# Patient Record
Sex: Male | Born: 1975 | Race: Black or African American | Hispanic: No | Marital: Single | Smoking: Never smoker
Health system: Southern US, Community
[De-identification: ages and names within clinical notes are randomized; demographics above are authoritative.]

## PROBLEM LIST (undated history)

## (undated) DIAGNOSIS — E7800 Pure hypercholesterolemia, unspecified: Secondary | ICD-10-CM

## (undated) DIAGNOSIS — R569 Unspecified convulsions: Secondary | ICD-10-CM

## (undated) HISTORY — PX: OTHER SURGICAL HISTORY: SHX169

---

## 1999-11-06 ENCOUNTER — Emergency Department (HOSPITAL_COMMUNITY): Admission: EM | Admit: 1999-11-06 | Discharge: 1999-11-06 | Payer: Self-pay | Admitting: Emergency Medicine

## 1999-11-12 ENCOUNTER — Emergency Department (HOSPITAL_COMMUNITY): Admission: EM | Admit: 1999-11-12 | Discharge: 1999-11-12 | Payer: Self-pay | Admitting: *Deleted

## 2009-06-17 ENCOUNTER — Emergency Department (HOSPITAL_BASED_OUTPATIENT_CLINIC_OR_DEPARTMENT_OTHER): Admission: EM | Admit: 2009-06-17 | Discharge: 2009-06-17 | Payer: Self-pay | Admitting: Emergency Medicine

## 2009-06-17 ENCOUNTER — Ambulatory Visit: Payer: Self-pay | Admitting: Diagnostic Radiology

## 2015-09-19 ENCOUNTER — Emergency Department (HOSPITAL_BASED_OUTPATIENT_CLINIC_OR_DEPARTMENT_OTHER)
Admission: EM | Admit: 2015-09-19 | Discharge: 2015-09-19 | Disposition: A | Discharge status: Home / Self Care | Payer: Medicare Other | Attending: Physician Assistant | Admitting: Physician Assistant

## 2015-09-19 ENCOUNTER — Emergency Department (HOSPITAL_BASED_OUTPATIENT_CLINIC_OR_DEPARTMENT_OTHER): Payer: Medicare Other

## 2015-09-19 ENCOUNTER — Encounter (HOSPITAL_BASED_OUTPATIENT_CLINIC_OR_DEPARTMENT_OTHER): Payer: Self-pay

## 2015-09-19 DIAGNOSIS — G40909 Epilepsy, unspecified, not intractable, without status epilepticus: Secondary | ICD-10-CM | POA: Diagnosis not present

## 2015-09-19 DIAGNOSIS — Y998 Other external cause status: Secondary | ICD-10-CM | POA: Diagnosis not present

## 2015-09-19 DIAGNOSIS — Y9389 Activity, other specified: Secondary | ICD-10-CM | POA: Insufficient documentation

## 2015-09-19 DIAGNOSIS — S299XXA Unspecified injury of thorax, initial encounter: Secondary | ICD-10-CM | POA: Diagnosis present

## 2015-09-19 DIAGNOSIS — W1839XA Other fall on same level, initial encounter: Secondary | ICD-10-CM | POA: Insufficient documentation

## 2015-09-19 DIAGNOSIS — E7800 Pure hypercholesterolemia, unspecified: Secondary | ICD-10-CM | POA: Diagnosis not present

## 2015-09-19 DIAGNOSIS — Z79899 Other long term (current) drug therapy: Secondary | ICD-10-CM | POA: Diagnosis not present

## 2015-09-19 DIAGNOSIS — S2232XA Fracture of one rib, left side, initial encounter for closed fracture: Secondary | ICD-10-CM | POA: Insufficient documentation

## 2015-09-19 DIAGNOSIS — Y92090 Kitchen in other non-institutional residence as the place of occurrence of the external cause: Secondary | ICD-10-CM | POA: Diagnosis not present

## 2015-09-19 DIAGNOSIS — F79 Unspecified intellectual disabilities: Secondary | ICD-10-CM | POA: Insufficient documentation

## 2015-09-19 HISTORY — DX: Pure hypercholesterolemia, unspecified: E78.00

## 2015-09-19 HISTORY — DX: Unspecified convulsions: R56.9

## 2015-09-19 MED ORDER — IBUPROFEN 800 MG PO TABS
800.0000 mg | ORAL_TABLET | Freq: Once | ORAL | Status: AC
  Administered 2015-09-19: 800 mg via ORAL
  Filled 2015-09-19: qty 1

## 2015-09-19 MED ORDER — OXYCODONE-ACETAMINOPHEN 5-325 MG PO TABS: 1.0000 | ORAL_TABLET | Freq: Four times a day (QID) | ORAL | Status: DC | PRN

## 2015-09-19 MED ORDER — IBUPROFEN 800 MG PO TABS: 800.0000 mg | ORAL_TABLET | Freq: Three times a day (TID) | ORAL | Status: DC

## 2015-09-19 NOTE — ED Provider Notes (Signed)
CSN: [id number]     Arrival date & time 09/19/15  1104 History   First MD Initiated Contact with Patient 09/19/15 1207     Chief Complaint  Patient presents with  . Rib Injury     (Consider location/radiation/quality/duration/timing/severity/associated sxs/prior Treatment) HPI   Patient is a developmentally delayed 39 year old male with history of seizure disorder presenting today with rib pain. Patient fell last week onto the kitchen counter. He has pain on the seventh rib on the left-hand side. Patient otherwise has had no shortness of breath no fevers or other symptoms.  Past Medical History  Diagnosis Date  . Seizures (HCC)   . High cholesterol    History reviewed. No pertinent past surgical history. No family history on file. Social History  Substance Use Topics  . Smoking status: Never Smoker   . Smokeless tobacco: None  . Alcohol Use: None    Review of Systems  Constitutional: Negative for activity change.  Respiratory: Negative for cough and shortness of breath.   Cardiovascular: Negative for chest pain.  Gastrointestinal: Negative for abdominal pain.      Allergies  Review of patient's allergies indicates no known allergies.  Home Medications   Prior to Admission medications   Medication Sig Start Date End Date Taking? Authorizing Provider  divalproex (DEPAKOTE) 500 MG DR tablet Take 500 mg by mouth 3 (three) times daily.   Yes Historical Provider, MD  levETIRAcetam (KEPPRA) 1000 MG tablet Take 1,000 mg by mouth 2 (two) times daily.   Yes Historical Provider, MD  Methsuximide 300 MG CAPS Take 300 mg by mouth 4 (four) times daily.   Yes Historical Provider, MD  pravastatin (PRAVACHOL) 80 MG tablet Take 80 mg by mouth daily.   Yes Historical Provider, MD   BP 136/80 mmHg  Pulse 76  Temp(Src) 97.7 F (36.5 C) (Oral)  Resp 20  SpO2 100% Physical Exam  Constitutional: He appears well-nourished.  HENT:  Head: Normocephalic.  Mouth/Throat: Oropharynx is  clear and moist.  Eyes: Conjunctivae are normal.  Neck: No tracheal deviation present.  Cardiovascular: Normal rate.   Pulmonary/Chest: Effort normal. No stridor. No respiratory distress. He exhibits tenderness.  Abdominal: Soft. There is no tenderness. There is no guarding.  No tenderness to the LUQ  Musculoskeletal: Normal range of motion. He exhibits no edema.  Skin: Skin is warm and dry. No rash noted. He is not diaphoretic.  Nursing note and vitals reviewed.   ED Course  Procedures (including critical care time) Labs Review Labs Reviewed - No data to display  Imaging Review Dg Ribs Unilateral W/chest Left  09/19/2015  CLINICAL DATA:  Fall, left hip pain, fall in the kitchen on Wednesday EXAM: LEFT RIBS AND CHEST - 3+ VIEW COMPARISON:  None. FINDINGS: *Four views left ribs submitted. No acute infiltrate or pulmonary edema. There is subtle nondisplaced fracture of the left seventh rib. No pneumothorax. IMPRESSION: No acute infiltrate or pulmonary edema. Subtle nondisplaced fracture of the left seventh rib. Electronically Signed   By: Natasha Mead M.D.   On: 09/19/2015 12:20   I have personally reviewed and evaluated these images and lab results as part of my medical decision-making.   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 Patient has subtle nondisplaced rib fracture left. We will give incentive spirometer, ibuprofen and Percocet for home. Patient's  family member aware of risk with Percocet and unsteadiness. Patient taught incentive spironmeter encouraged to take deep breaths. Patient's family warned about increased risk of pneumonia.  Parys Elenbaas Randall AnLyn Aundreya Souffrant, MD 09/19/15 1253

## 2015-09-19 NOTE — Discharge Instructions (Signed)

## 2015-09-19 NOTE — ED Notes (Signed)
Patient here with left rib pain after falling in kitchen on Wednesday. No loc, complaining of left rib pain with any movement and inspiration

## 2017-04-16 ENCOUNTER — Emergency Department (HOSPITAL_BASED_OUTPATIENT_CLINIC_OR_DEPARTMENT_OTHER): Payer: Medicare Other

## 2017-04-16 ENCOUNTER — Emergency Department (HOSPITAL_BASED_OUTPATIENT_CLINIC_OR_DEPARTMENT_OTHER)
Admission: EM | Admit: 2017-04-16 | Discharge: 2017-04-16 | Disposition: A | Discharge status: Home / Self Care | Payer: Medicare Other | Attending: Emergency Medicine | Admitting: Emergency Medicine

## 2017-04-16 ENCOUNTER — Encounter (HOSPITAL_BASED_OUTPATIENT_CLINIC_OR_DEPARTMENT_OTHER): Payer: Self-pay | Admitting: Emergency Medicine

## 2017-04-16 DIAGNOSIS — Y998 Other external cause status: Secondary | ICD-10-CM | POA: Diagnosis not present

## 2017-04-16 DIAGNOSIS — Z23 Encounter for immunization: Secondary | ICD-10-CM | POA: Diagnosis not present

## 2017-04-16 DIAGNOSIS — Y92018 Other place in single-family (private) house as the place of occurrence of the external cause: Secondary | ICD-10-CM | POA: Insufficient documentation

## 2017-04-16 DIAGNOSIS — W1839XA Other fall on same level, initial encounter: Secondary | ICD-10-CM | POA: Diagnosis not present

## 2017-04-16 DIAGNOSIS — G40814 Lennox-Gastaut syndrome, intractable, without status epilepticus: Secondary | ICD-10-CM | POA: Diagnosis not present

## 2017-04-16 DIAGNOSIS — R569 Unspecified convulsions: Secondary | ICD-10-CM

## 2017-04-16 DIAGNOSIS — Y93E1 Activity, personal bathing and showering: Secondary | ICD-10-CM | POA: Insufficient documentation

## 2017-04-16 DIAGNOSIS — S0101XA Laceration without foreign body of scalp, initial encounter: Secondary | ICD-10-CM | POA: Insufficient documentation

## 2017-04-16 DIAGNOSIS — S0990XA Unspecified injury of head, initial encounter: Secondary | ICD-10-CM | POA: Diagnosis present

## 2017-04-16 DIAGNOSIS — Z79899 Other long term (current) drug therapy: Secondary | ICD-10-CM | POA: Diagnosis not present

## 2017-04-16 MED ORDER — LIDOCAINE-EPINEPHRINE (PF) 2 %-1:200000 IJ SOLN
10.0000 mL | Freq: Once | INTRAMUSCULAR | Status: AC
  Administered 2017-04-16: 10 mL
  Filled 2017-04-16: qty 10

## 2017-04-16 MED ORDER — TETANUS-DIPHTH-ACELL PERTUSSIS 5-2.5-18.5 LF-MCG/0.5 IM SUSP
0.5000 mL | Freq: Once | INTRAMUSCULAR | Status: AC
  Administered 2017-04-16: 0.5 mL via INTRAMUSCULAR
  Filled 2017-04-16: qty 0.5

## 2017-04-16 NOTE — ED Provider Notes (Signed)
[facility] Provider Note   CSN: [id number] Arrival date & time: 04/16/17  1347  By signing my name below, I, Matthew Vang, attest that this documentation has been prepared under the direction and in the presence of Rolan BuccoBelfi, Delila Kuklinski, MD. Electronically Signed: Rosario AdieWilliam Andrew Vang, ED Scribe. 04/16/17. 3:31 PM.  History   Chief Complaint Chief Complaint  Patient presents with  . Laceration   The history is provided by the patient and a parent. No language interpreter was used.    Matthew Vang is a 41 y.o. 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 Bleeding is controlled with dressing. Per mother, pt was exiting the shower in their home today when he had a seizure, causing him to fall striking his left-sided head on the stall near him. Mother was not in the room w/ him during the seizure, but she states that his seizures usually last for approximately five minutes each time. . She reports that he is currently back to his baseline while in the ED. She denies any atypical symptoms. His last seizure was about 2 months ago. Pt reports some pain around the site of his wound and some left-sided neck pain as well. No other reported injury. He has been compliant with all of his recent anti-convulsants and there have been no recent change in medications. He has not recently been sick. Mother denies fever, cough, congestion, nausea, vomiting, or any other associated symptoms. Tetanus is not UTD.   Past Medical History:  Diagnosis Date  . High cholesterol   . Seizures (HCC)    There are no active problems to display for this patient.  History reviewed. No pertinent surgical history.  Home Medications    Prior to Admission medications   Medication Sig Start Date End Date Taking? Authorizing Provider  divalproex (DEPAKOTE) 500 MG DR  tablet Take 500 mg by mouth 3 (three) times daily.   Yes [provider]  levETIRAcetam (KEPPRA) 1000 MG tablet Take 1,000 mg by mouth 2 (two) times daily.   Yes [provider]  Methsuximide 300 MG CAPS Take 300 mg by mouth 4 (four) times daily.   Yes [provider]  pravastatin (PRAVACHOL) 80 MG tablet Take 80 mg by mouth daily.   Yes [provider]  ibuprofen (ADVIL,MOTRIN) 800 MG tablet Take 1 tablet (800 mg total) by mouth 3 (three) times daily. 09/19/15   Mackuen, [name], MD  oxyCODONE-acetaminophen (PERCOCET/ROXICET) 5-325 MG tablet Take 1 tablet by mouth every 6 (six) hours as needed for severe pain. 09/19/15   Mackuen, [name], MD   Family History No family history on file.  Social History Social History  Substance Use Topics  . Smoking status: Never Smoker  . Smokeless tobacco: Never Used  . Alcohol use Not on file   Allergies   Patient has no known allergies.  Review of Systems Review of Systems  Unable to perform ROS: Patient nonverbal   Physical Exam Updated Vital Signs BP 114/84 (BP Location: Left Arm)   Pulse 76   Temp 98.2 F (36.8 C) (Oral)   Resp 18   Ht 5\' 10"  (1.778 m)   Wt 139 lb (63 kg)   SpO2 98%   BMI 19.94 kg/m   Physical Exam  Constitutional: He appears well-developed and well-nourished.  HENT:  Head: Normocephalic and atraumatic.  1.5cm  c-shaped laceration just behind the left ear. There is no involvement of the pinna. Moderate hematoma around the bony portion of the skull with some underlying tenderness.   Eyes: Pupils are equal, round, and reactive to light.  Neck: Normal range of motion. Neck supple.  Tenderness throughout the c-spine. No step offs or deformities.   Cardiovascular: Normal rate, regular rhythm and normal heart sounds.   Pulmonary/Chest: Effort normal and breath sounds normal. No respiratory distress. He has no wheezes. He has no rales. He exhibits no tenderness.  Abdominal:  Soft. Bowel sounds are normal. There is no tenderness. There is no rebound and no guarding.  Musculoskeletal: Normal range of motion. He exhibits no edema.  No pain to the thoracic or lumbo-sacral spine. No step offs or deformities.   Lymphadenopathy:    He has no cervical adenopathy.  Neurological: He is alert.  Patient awake and appears alert.[name] He is predominantly nonverbal. Mom states that he's had baseline mental status. He is moving all actually symmetrically. No facial drooping or focal deficits are noted.  Skin: Skin is warm and dry. No rash noted.  Psychiatric: He has a normal mood and affect.   ED Treatments / Results  DIAGNOSTIC STUDIES: Oxygen Saturation is 98% on RA, normal by my interpretation.   COORDINATION OF CARE: 3:31 PM-Discussed next steps with pt. Pt verbalized understanding and is agreeable with the plan.   Labs (all labs ordered are listed, but only abnormal results are displayed) Labs Reviewed - No data to display  EKG  EKG Interpretation None      Radiology Ct Head Wo Contrast  Result Date: 04/16/2017 CLINICAL DATA:  41 year old male with acute seizure today with fall and head and neck injury. Initial encounter. EXAM: CT HEAD WITHOUT CONTRAST CT CERVICAL SPINE WITHOUT CONTRAST TECHNIQUE: Multidetector CT imaging of the head and cervical spine was performed following the standard protocol without intravenous contrast. Multiplanar CT image reconstructions of the cervical spine were also generated. COMPARISON:  None. FINDINGS: CT HEAD FINDINGS Brain: No evidence of acute infarction, hemorrhage, hydrocephalus, extra-axial collection or mass lesion/mass effect. Cerebral and cerebellar atrophy again noted. Vascular: Intracranial atherosclerotic calcifications noted. Skull: Normal. Negative for fracture or focal lesion. Sinuses/Orbits: No acute finding. Other: Mild soft tissue swelling in the posterior right scalp and posterior to the left ear noted. CT CERVICAL  SPINE FINDINGS Alignment: Normal. Skull base and vertebrae: No acute fracture. No primary bone lesion or focal pathologic process. Soft tissues and spinal canal: No prevertebral fluid or swelling. No visible canal hematoma. Disc levels: Mild congenital central spinal narrowing throughout the cervical spine noted. Moderate anterior spondylosis at C5-6 noted. Upper chest: Negative. Other: None IMPRESSION: No evidence of acute intracranial abnormality. Cerebral and cerebellar atrophy. Mild soft tissue swelling of the posterior right scalp and posterior to the left ear. No fracture. No static evidence of acute injury to the cervical spine. Mild multilevel congenital central spinal narrowing. Electronically Signed   By: Harmon Pier M.D.   On: 04/16/2017 16:05   Ct Cervical Spine Wo Contrast  Result Date: 04/16/2017 CLINICAL DATA:  41 year old male with acute seizure today with fall and head and neck injury. Initial encounter. EXAM: CT HEAD WITHOUT CONTRAST CT CERVICAL SPINE WITHOUT CONTRAST TECHNIQUE: Multidetector CT imaging of the head and cervical spine was performed following the standard protocol without intravenous contrast. Multiplanar CT image reconstructions of the cervical spine were also generated. COMPARISON:  None. FINDINGS: CT HEAD FINDINGS Brain: No evidence of acute infarction, hemorrhage,  hydrocephalus, extra-axial collection or mass lesion/mass effect. Cerebral and cerebellar atrophy again noted. Vascular: Intracranial atherosclerotic calcifications noted. Skull: Normal. Negative for fracture or focal lesion. Sinuses/Orbits: No acute finding. Other: Mild soft tissue swelling in the posterior right scalp and posterior to the left ear noted. CT CERVICAL SPINE FINDINGS Alignment: Normal. Skull base and vertebrae: No acute fracture. No primary bone lesion or focal pathologic process. Soft tissues and spinal canal: No prevertebral fluid or swelling. No visible canal hematoma. Disc levels: Mild congenital  central spinal narrowing throughout the cervical spine noted. Moderate anterior spondylosis at C5-6 noted. Upper chest: Negative. Other: None IMPRESSION: No evidence of acute intracranial abnormality. Cerebral and cerebellar atrophy. Mild soft tissue swelling of the posterior right scalp and posterior to the left ear. No fracture. No static evidence of acute injury to the cervical spine. Mild multilevel congenital central spinal narrowing. Electronically Signed   By: Harmon Pier M.D.   On: 04/16/2017 16:05    Procedures .Marland KitchenLaceration Repair Date/Time: 04/16/2017 4:37 PM Performed by: Ivy Meriwether Authorized by: Rolan Bucco   Consent:    Consent obtained:  Verbal   Consent given by:  Patient and parent   Risks discussed:  Infection, need for additional repair, nerve damage, poor wound healing, poor cosmetic result and pain   Alternatives discussed:  No treatment Universal protocol:    Procedure explained and questions answered to patient or proxy's satisfaction: yes     Relevant documents present and verified: yes     Test results available and properly labeled: yes     Imaging studies available: yes     Required blood products, implants, devices, and special equipment available: yes     Site/side marked: yes     Immediately prior to procedure, a time out was called: yes     Patient identity confirmed:  Verbally with patient, arm band and provided demographic data Anesthesia (see MAR for exact dosages):    Anesthesia method:  Local infiltration   Local anesthetic:  Lidocaine 2% WITH epi Laceration details:    Location: behind left ear.   Length (cm):  1.5 Repair type:    Repair type:  Simple Pre-procedure details:    Preparation:  Patient was prepped and draped in usual sterile fashion and imaging obtained to evaluate for foreign bodies Treatment:    Area cleansed with:  Betadine   Amount of cleaning:  Extensive   Irrigation solution:  Sterile saline   Irrigation method:   Syringe   Visualized foreign bodies/material removed: no   Skin repair:    Repair method:  Sutures   Suture size:  5-0   Suture material:  Prolene   Suture technique:  Simple interrupted   Number of sutures:  3 Approximation:    Approximation:  Close Post-procedure details:    Dressing:  Open (no dressing)   Patient tolerance of procedure:  Tolerated well, no immediate complications     Medications Ordered in ED Medications  Tdap (BOOSTRIX) injection 0.5 mL (0.5 mLs Intramuscular Given 04/16/17 1545)  lidocaine-EPINEPHrine (XYLOCAINE W/EPI) 2 %-1:200000 (PF) injection 10 mL (10 mLs Infiltration Given by Other 04/16/17 1550)   Initial Impression / Assessment and Plan / ED Course  I have reviewed the triage vital signs and the nursing notes.  Pertinent labs & imaging results that were available during my care of the patient were reviewed by me and considered in my medical decision making (see chart for details).     Patient presents with a laceration  after having a seizure. Sounds like the seizure was typical for him and he is currently back to his baseline mental status. He's been taking all medications as prescribed. The wound was repaired in the ED. His tetanus shot was updated. He is back to his baseline mental status. He was discharged home in good condition. Was advised to have him follow-up with his neurologist at [facility]. He was also advised in wound care instructions and that he will need to have the stitches removed in about a week.  Final Clinical Impressions(s) / ED Diagnoses   Final diagnoses:  Laceration of scalp, initial encounter  Seizure [facility])   New Prescriptions New Prescriptions   No medications on file   I personally performed the services described in this documentation, which was scribed in my presence.  The recorded information has been reviewed and considered.     Rolan Bucco, MD 04/16/17 [phone]

## 2017-04-16 NOTE — ED Triage Notes (Signed)
Pt has hx of epilepsy. Pt was getting out of the shower when he had a seizure causing him to fall. Pt has a laceration behind his L ear. Pt is treated for seizures at [facility].

## 2017-04-26 ENCOUNTER — Emergency Department (HOSPITAL_BASED_OUTPATIENT_CLINIC_OR_DEPARTMENT_OTHER)
Admission: EM | Admit: 2017-04-26 | Discharge: 2017-04-26 | Disposition: A | Discharge status: Home / Self Care | Payer: Medicare Other | Attending: Physician Assistant | Admitting: Physician Assistant

## 2017-04-26 ENCOUNTER — Encounter (HOSPITAL_BASED_OUTPATIENT_CLINIC_OR_DEPARTMENT_OTHER): Payer: Self-pay

## 2017-04-26 DIAGNOSIS — Z4802 Encounter for removal of sutures: Secondary | ICD-10-CM | POA: Diagnosis not present

## 2017-04-26 NOTE — Discharge Instructions (Signed)
Follow-up with your primary care doctor as needed.   Continue to keep the wound clean and dry.  Return to the Emergency Dept for any redness, swelling, drainage, fevers, or any other worsening or concerning symptoms.

## 2017-04-26 NOTE — ED Provider Notes (Signed)
[facility] Note   CSN: [id number] Arrival date & time: 04/26/17  1547  By signing my name below, I, Cynda Acres, attest that this documentation has been prepared under the direction and in the presence of Graciella Freer, PA-C . Electronically Signed: Cynda Acres, Scribe. 04/26/17. 4:11 PM.  History   Chief Complaint Chief Complaint  Patient presents with  . Suture / Staple Removal    HPI Comments: Rayford Williamsen is a 41 y.o. male who presents to the Emergency Department for a suture removal of the posterior left ear, which were placed 10 days ago. Patient was noted to have fell, causing a laceration to the posterior ear. Patient had 3 sutures placed 10 days ago. No modifying factors indicated. Mother denies any fever, chills, numbness, tingling, weakness, or any other symptoms.    The history is provided by the patient and a parent. No language interpreter was used.    Past Medical History:  Diagnosis Date  . High cholesterol   . Seizures (HCC)     There are no active problems to display for this patient.   History reviewed. No pertinent surgical history.     Home Medications    Prior to Admission medications   Medication Sig Start Date End Date Taking? Authorizing Provider  divalproex (DEPAKOTE) 500 MG DR tablet Take 500 mg by mouth 3 (three) times daily.    [provider]  ibuprofen (ADVIL,MOTRIN) 800 MG tablet Take 1 tablet (800 mg total) by mouth 3 (three) times daily. 09/19/15   Mackuen, [name], MD  levETIRAcetam (KEPPRA) 1000 MG tablet Take 1,000 mg by mouth 2 (two) times daily.    [provider]  Methsuximide 300 MG CAPS Take 300 mg by mouth 4 (four) times daily.    [provider]  oxyCODONE-acetaminophen (PERCOCET/ROXICET) 5-325 MG tablet Take 1 tablet by mouth every 6 (six) hours as needed for severe pain. 09/19/15   Mackuen, [name], MD  pravastatin (PRAVACHOL) 80 MG tablet Take 80 mg by mouth  daily.    [provider]    Family History No family history on file.  Social History Social History  Substance Use Topics  . Smoking status: Never Smoker  . Smokeless tobacco: Never Used  . Alcohol use No     Allergies   Patient has no known allergies.   Review of Systems Review of Systems  Constitutional: Negative for fever.  Skin: Positive for wound (posterior left ear).  All other systems reviewed and are negative.    Physical Exam Updated Vital Signs BP 101/81 (BP Location: Left Arm)   Pulse 75   Temp 98.5 F (36.9 C) (Oral)   Resp 18   Ht 5\' 10"  (1.778 m)   Wt 138 lb (62.6 kg)   SpO2 99%   BMI 19.80 kg/m   Physical Exam  Constitutional: He appears well-developed and well-nourished.  HENT:  Head: Normocephalic and atraumatic.  Posterior left ear with well healed incision site with mild surrounding erythema. No warmth or drainage.   Eyes: Conjunctivae and EOM are normal. Right eye exhibits no discharge. Left eye exhibits no discharge. No scleral icterus.  Pulmonary/Chest: Effort normal.  Musculoskeletal: He exhibits no deformity.  Neurological: He is alert.  Skin: Skin is warm and dry.  Psychiatric: He has a normal mood and affect. His speech is normal and behavior is normal.  Nursing note and vitals reviewed.    ED Treatments / Results  DIAGNOSTIC STUDIES: Oxygen Saturation is  99% on RA, normal by my interpretation.    COORDINATION OF CARE: 4:10 PM Discussed treatment plan with pt at bedside and pt agreed to plan, which includes suture removal.   Labs (all labs ordered are listed, but only abnormal results are displayed) Labs Reviewed - No data to display  EKG  EKG Interpretation None       Radiology No results found.  Procedures Procedures (including critical care time)  Medications Ordered in ED Medications - No data to display   Initial Impression / Assessment and Plan / ED Course  I have reviewed the triage vital  signs and the nursing notes.  Pertinent labs & imaging results that were available during my care of the patient were reviewed by me and considered in my medical decision making (see chart for details).     41 year old male who presents with suture removal for a posterior left ear laceration. Patient was seen 10 days ago after a fall causing laceration and had sutures in place. Mom has been cleaning and keeping the wound dry. She denies any drainage. She denies any fevers or Occasions. Sutures removed in department. Bacitracin placed on wound. Wound care instructions given to mom. Patient struck to follow up with primary care as needed. Strict return precautions given. Patient expresses understanding and agreement plan.    Final Clinical Impressions(s) / ED Diagnoses   Final diagnoses:  None    New Prescriptions New Prescriptions   No medications on file   I personally performed the services described in this documentation, which was scribed in my presence. The recorded information has been reviewed and is accurate.     Maxwell CaulLayden, Jaslynne Dahan A, PA-C 04/26/17 1656    Abelino DerrickMackuen, [name], MD 04/26/17 2218

## 2017-04-26 NOTE — ED Triage Notes (Signed)
Pt for sutre remval-placed here 5/13-NAD-mother with pt

## 2017-10-17 ENCOUNTER — Emergency Department (HOSPITAL_BASED_OUTPATIENT_CLINIC_OR_DEPARTMENT_OTHER): Payer: Medicare Other

## 2017-10-17 ENCOUNTER — Other Ambulatory Visit: Payer: Self-pay

## 2017-10-17 ENCOUNTER — Encounter (HOSPITAL_BASED_OUTPATIENT_CLINIC_OR_DEPARTMENT_OTHER): Payer: Self-pay | Admitting: *Deleted

## 2017-10-17 ENCOUNTER — Emergency Department (HOSPITAL_BASED_OUTPATIENT_CLINIC_OR_DEPARTMENT_OTHER)
Admission: EM | Admit: 2017-10-17 | Discharge: 2017-10-17 | Disposition: A | Discharge status: Home / Self Care | Payer: Medicare Other | Attending: Physician Assistant | Admitting: Physician Assistant

## 2017-10-17 DIAGNOSIS — M779 Enthesopathy, unspecified: Secondary | ICD-10-CM

## 2017-10-17 DIAGNOSIS — Z79899 Other long term (current) drug therapy: Secondary | ICD-10-CM | POA: Insufficient documentation

## 2017-10-17 DIAGNOSIS — M25521 Pain in right elbow: Secondary | ICD-10-CM | POA: Diagnosis present

## 2017-10-17 NOTE — ED Provider Notes (Signed)
[facility] EMERGENCY DEPARTMENT Provider Note   CSN: [id number] Arrival date & time: 10/17/17  1313     History   Chief Complaint Chief Complaint  Patient presents with  . Elbow Pain    HPI Matthew Vang is a 41 y.o. male.  HPI   Patient is a 41 year old male presenting with right elbow pain.  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  He has a seizure disorder.  He uses physical therapy.  He recently has been using some 2 pound weight family member thinks that he has been using them too quickly. nd sometimes grimaces with palpitation of the right elbow.  No known trauma.  Physical therapist thought that might be tendinitis.  Past Medical History:  Diagnosis Date  . High cholesterol   . Seizures (HCC)     There are no active problems to display for this patient.   History reviewed. No pertinent surgical history.     Home Medications    Prior to Admission medications   Medication Sig Start Date End Date Taking? Authorizing Provider  divalproex (DEPAKOTE) 500 MG DR tablet Take 500 mg by mouth 3 (three) times daily.    [provider]  ibuprofen (ADVIL,MOTRIN) 800 MG tablet Take 1 tablet (800 mg total) by mouth 3 (three) times daily. 09/19/15   Madix Blowe Lyn, MD  levETIRAcetam (KEPPRA) 1000 MG tablet Take 1,000 mg by mouth 2 (two) times daily.    [provider]  Methsuximide 300 MG CAPS Take 300 mg by mouth 4 (four) times daily.    [provider]  pravastatin (PRAVACHOL) 80 MG tablet Take 80 mg by mouth daily.    [provider]    Family History History reviewed. No pertinent family history.  Social History Social History   Tobacco Use  . Smoking status: Never Smoker  . Smokeless tobacco: Never Used  Substance Use Topics  . Alcohol use: No  . Drug use: No     Allergies   Patient has no known allergies.   Review of Systems Review of Systems  Constitutional: Negative for  activity change.  Respiratory: Negative for shortness of breath.   Cardiovascular: Negative for chest pain.  Gastrointestinal: Negative for abdominal pain.     Physical Exam Updated Vital Signs BP 129/65 (BP Location: Left Arm)   Pulse 66   Temp 97.6 F (36.4 C) (Oral)   Resp 16   Ht 5\' 10"  (1.778 m)   Wt 63.5 kg (140 lb)   SpO2 100%   BMI 20.09 kg/m   Physical Exam  Constitutional: He is oriented to person, place, and time. He appears well-nourished.  HENT:  Head: Normocephalic.  Eyes: Conjunctivae are normal.  Cardiovascular: Normal rate.  Pulmonary/Chest: Effort normal.  Musculoskeletal:  No tenderness or grimacing palpitation of antecubital fossa, right arm all the way from wrist to shoulder.  Normal range of motion.  No pain noted.  Neurological: He is oriented to person, place, and time.  Skin: Skin is warm and dry. He is not diaphoretic.  Psychiatric: He has a normal mood and affect. His behavior is normal.     ED Treatments / Results  Labs (all labs ordered are listed, but only abnormal results are displayed) Labs Reviewed - No data to display  EKG  EKG Interpretation None       Radiology Dg Elbow Complete Right  Result Date: 10/17/2017 CLINICAL DATA:  Two weeks of anterior right elbow pain with no  known injury. EXAM: RIGHT ELBOW - COMPLETE 3+ VIEW COMPARISON:  None in PACs FINDINGS: The bones are subjectively adequately mineralized. There is no acute or healing fracture. There is no lytic or blastic lesion. There is no joint effusion or abnormal soft tissue calcification. IMPRESSION: There is no acute or significant chronic bony abnormality of the right elbow. Electronically Signed   By: David  SwazilandJordan M.D.   On: 10/17/2017 13:56    Procedures Procedures (including critical care time)  Medications Ordered in ED Medications - No data to display   Initial Impression / Assessment and Plan / ED Course  I have reviewed the triage vital signs and the  nursing notes.  Pertinent labs & imaging results that were available during my care of the patient were reviewed by me and considered in my medical decision making (see chart for details).      Patient is a 41 year old male presenting with right elbow pain.  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  He has a seizure disorder.  He uses physical therapy.  He recently has been using some 2 pound weight family member thinks that he has been using them too quickly. nd sometimes grimaces with palpitation of the right elbow.  No known trauma.  Physical therapist thought that might be tendinitis.  Patient with completely reassuring exam.  Will get x-ray for occult fracture.  Otherwise will treat with for tendinitis with instructions to use ibuprofen, Tylenol, heat and ice.  Final Clinical Impressions(s) / ED Diagnoses   Final diagnoses:  Tendonitis    ED Discharge Orders    None       Abelino DerrickMackuen, Asser Lucena Lyn, MD 10/17/17 1544

## 2017-10-17 NOTE — Discharge Instructions (Signed)
We think your son likely has a tendinitis to his elbow.  Please use Ace bandage, ice, heat as needed.

## 2017-10-17 NOTE — ED Triage Notes (Signed)
Mother states pt has been complaining about right elbow pain x 3 weeks, no injury

## 2018-08-22 IMAGING — DX DG ELBOW COMPLETE 3+V*R*
4 series · 4 of 4 positions shown · non-contrast
Comparison: None in PACs

CLINICAL DATA: Two weeks of anterior right elbow pain with no known
injury.

EXAM:
RIGHT ELBOW - COMPLETE 3+ VIEW

[elbow ap]
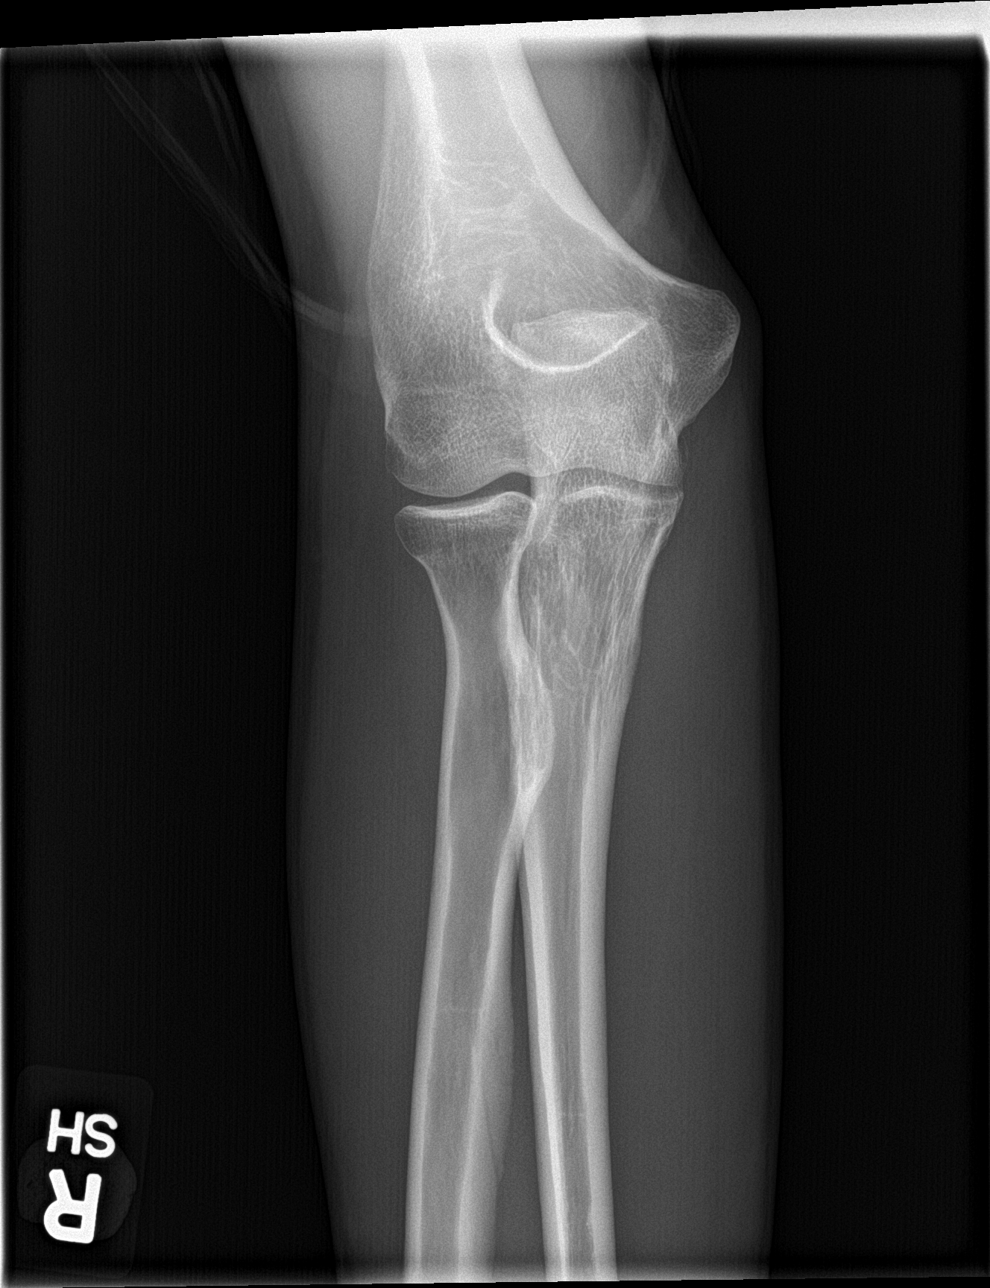

[elbow obl (1 of 2)]
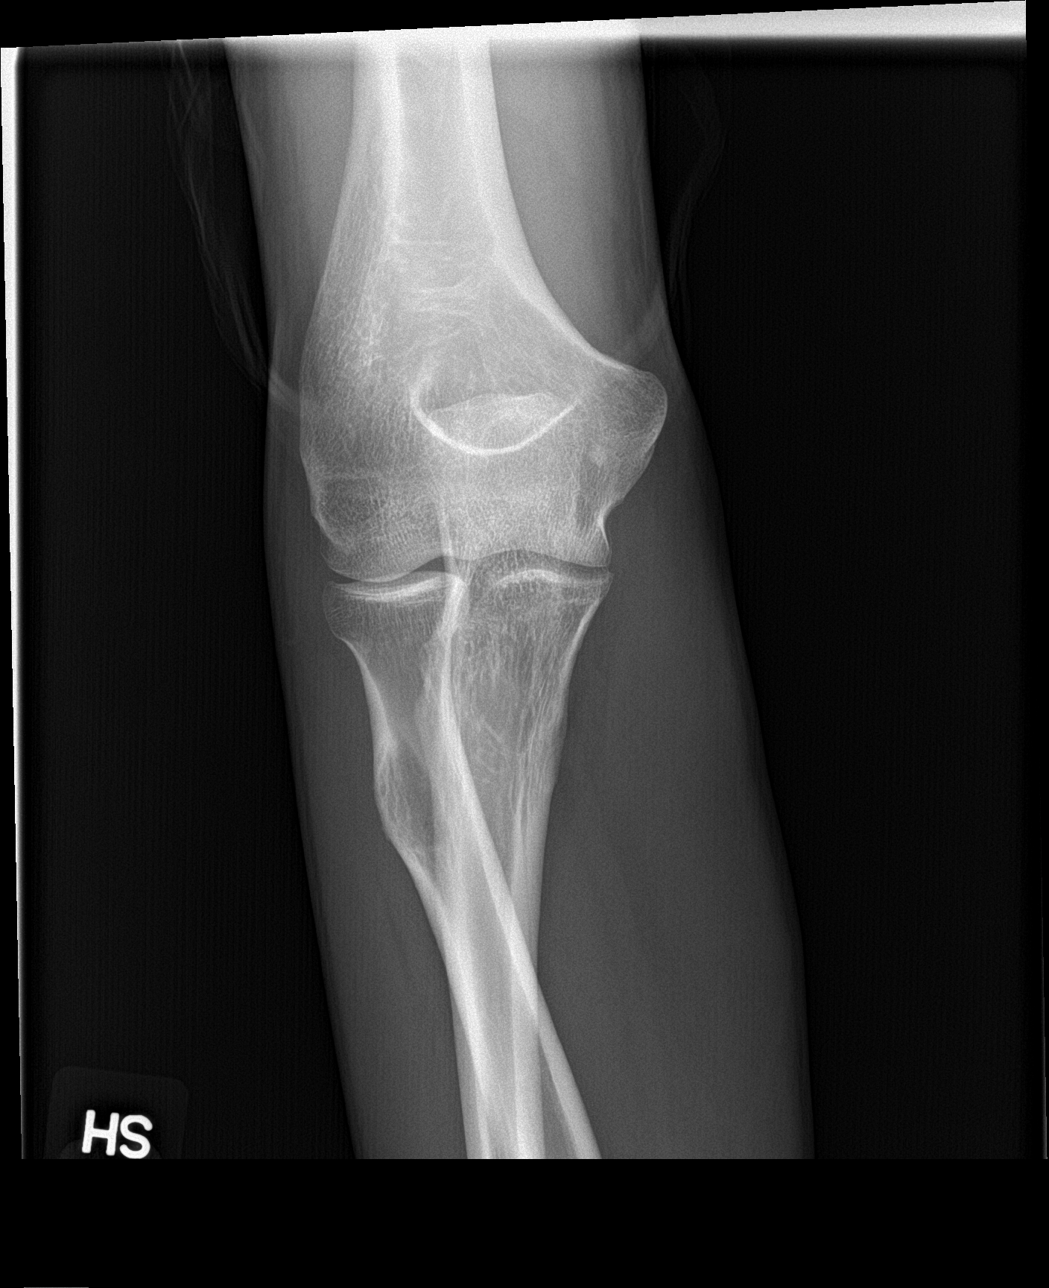

[elbow obl (2 of 2)]
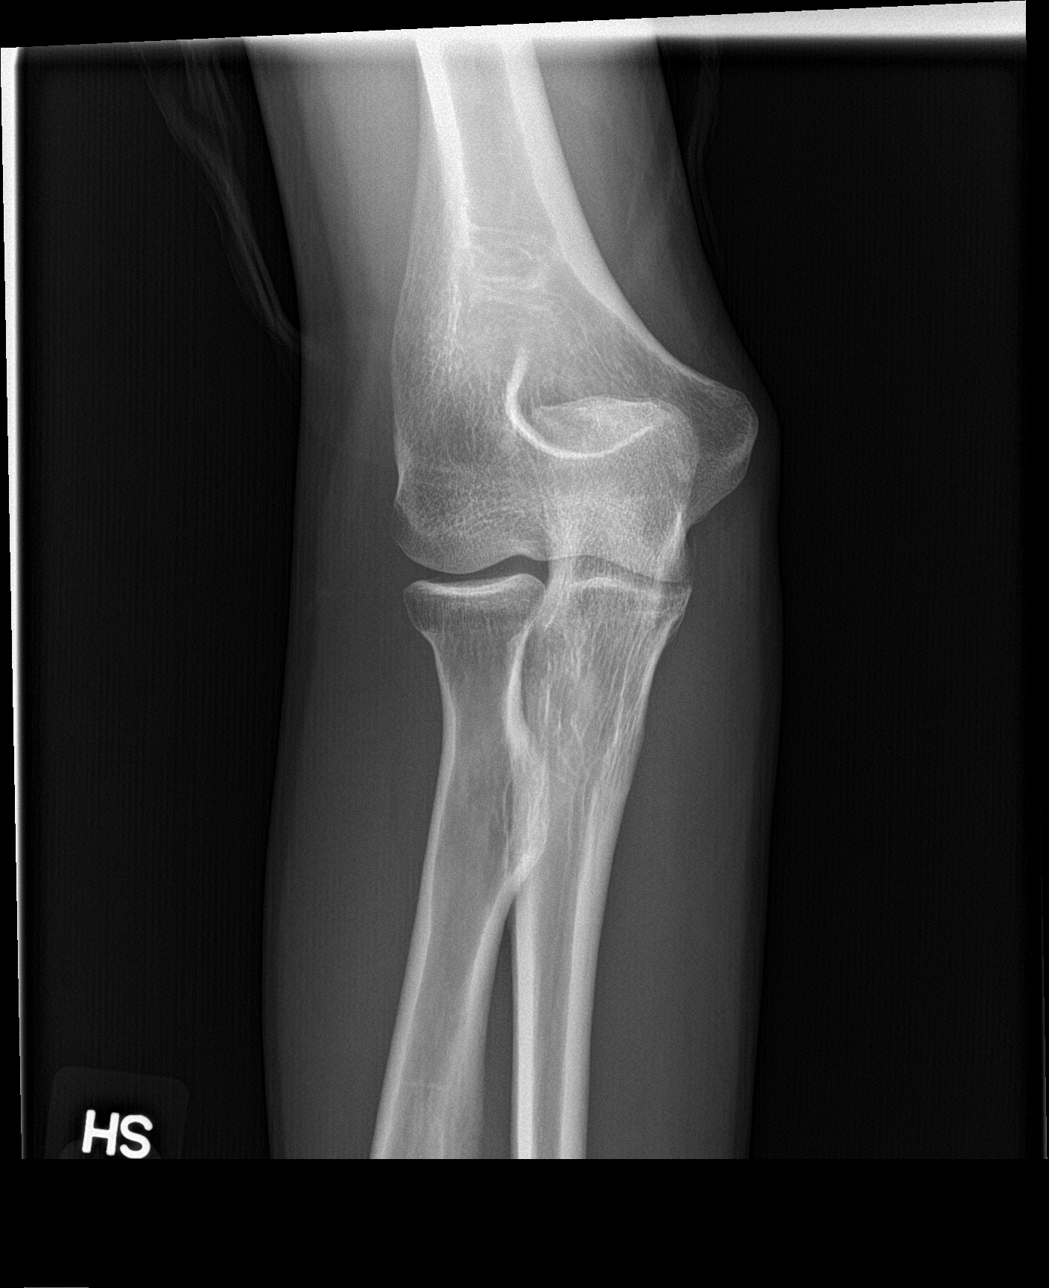

[elbow lat]
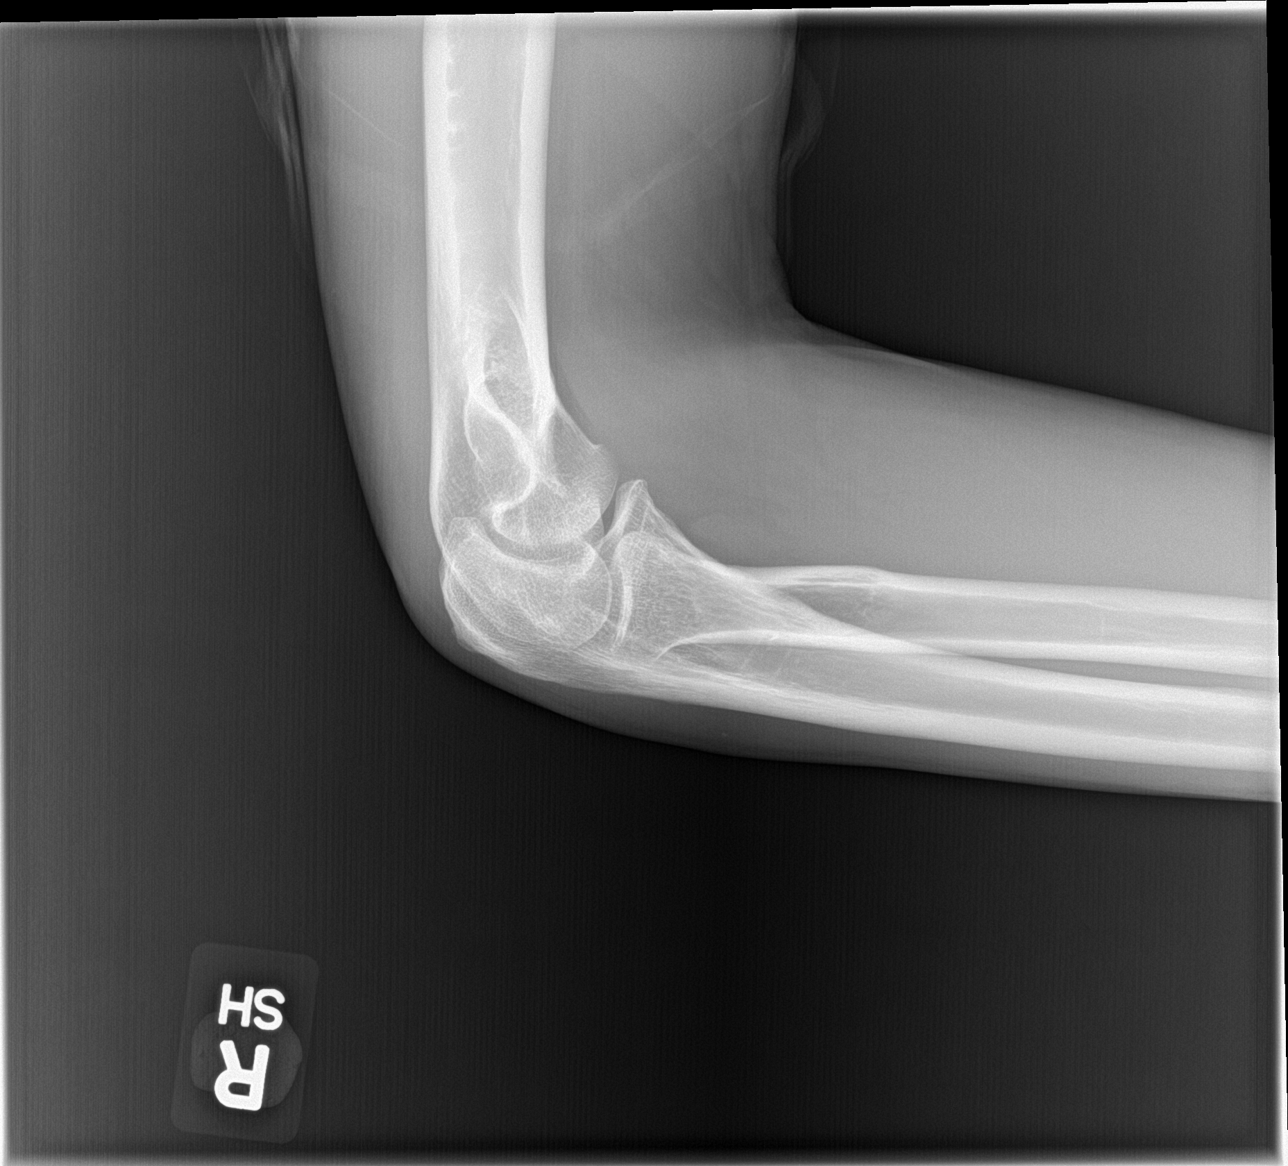

[4 of 4 positions shown; findings below may reference images not displayed]

FINDINGS: The bones are subjectively adequately mineralized. There is no acute
or healing fracture. There is no lytic or blastic lesion. There is
no joint effusion or abnormal soft tissue calcification.
IMPRESSION: There is no acute or significant chronic bony abnormality of the
right elbow.

## 2023-02-27 ENCOUNTER — Ambulatory Visit (INDEPENDENT_AMBULATORY_CARE_PROVIDER_SITE_OTHER): Payer: Medicare Other | Admitting: Urology

## 2023-02-27 ENCOUNTER — Encounter: Payer: Self-pay | Admitting: Urology

## 2023-02-27 VITALS — BP 112/76 | HR 81 | Ht 69.69 in | Wt 151.0 lb

## 2023-02-27 DIAGNOSIS — N3281 Overactive bladder: Secondary | ICD-10-CM | POA: Insufficient documentation

## 2023-02-27 DIAGNOSIS — N3941 Urge incontinence: Secondary | ICD-10-CM

## 2023-02-27 LAB — URINALYSIS, ROUTINE W REFLEX MICROSCOPIC
Bilirubin, UA: NEGATIVE
Glucose, UA: NEGATIVE
Ketones, UA: NEGATIVE
Leukocytes,UA: NEGATIVE
Nitrite, UA: NEGATIVE
RBC, UA: NEGATIVE
Specific Gravity, UA: 1.02 (ref 1.005–1.030)
Urobilinogen, Ur: 0.2 mg/dL (ref 0.2–1.0)
pH, UA: 5.5 (ref 5.0–7.5)

## 2023-02-27 LAB — BLADDER SCAN AMB NON-IMAGING

## 2023-02-27 NOTE — Progress Notes (Signed)
   Assessment: 1. Urge incontinence   2. OAB (overactive bladder)     Plan: I personally reviewed the patient's chart including provider notes, and lab results. Diagnosis and management of overactive bladder symptoms and urge incontinence discussed with the patient and his mother today.  Options for management including avoidance of dietary irritants, behavioral modification, medical therapy, neuromodulation, and chemodenervation discussed. At this time, they are not interested in pursuing any medical therapy for his symptoms. Bladder diet sheet provided. Return to office as needed.  Chief Complaint:  Chief Complaint  Patient presents with   Urinary Incontinence    History of Present Illness:  Matthew Vang is a 47 y.o. male who is seen in consultation from Si Gaul, [place] for evaluation of urinary frequency, urgency, and urge incontinence. He has had lower urinary tract symptoms for several months.  He is having symptoms of frequency, urgency, nocturia 1-2 times, and urge incontinence.  No dysuria or gross hematuria.  He is wearing adult diapers daytime and nighttime. He does have a history of constipation with bowel movements approximately 3 times per week.  He is on a fiber supplement.  No problems with fecal incontinence. IPSS = 14 today.  PSA from 02/09/2023: 0.9 Urinalysis from 02/09/2023: Trace blood, small protein  Past Medical History:  Past Medical History:  Diagnosis Date   High cholesterol    Seizures (HCC)     Past Surgical History:  Past Surgical History:  Procedure Laterality Date   None      Allergies:  No Known Allergies  Family History:  History reviewed. No pertinent family history.  Social History:  Social History   Tobacco Use   Smoking status: Never   Smokeless tobacco: Never  Substance Use Topics   Alcohol use: No   Drug use: No    Review of symptoms:  Constitutional:  Negative for unexplained weight loss, night sweats, fever,  chills ENT:  Negative for nose bleeds, sinus pain, painful swallowing CV:  Negative for chest pain, shortness of breath, exercise intolerance, palpitations, loss of consciousness Resp:  Negative for cough, wheezing, shortness of breath GI:  Negative for nausea, vomiting, diarrhea, bloody stools GU:  Positives noted in HPI; otherwise negative for gross hematuria, dysuria Neuro:  Negative for seizures, poor balance, limb weakness, slurred speech Psych:  Negative for lack of energy, depression, anxiety Endocrine:  Negative for polydipsia, polyuria, symptoms of hypoglycemia (dizziness, hunger, sweating) Hematologic:  Negative for anemia, purpura, petechia, prolonged or excessive bleeding, use of anticoagulants  Allergic:  Negative for difficulty breathing or choking as a result of exposure to anything; no shellfish allergy; no allergic response (rash/itch) to materials, foods  Physical exam: BP 112/76   Pulse 81   Ht 5' 9.69" (1.77 m)   Wt 151 lb (68.5 kg)   BMI 21.86 kg/m  GENERAL APPEARANCE:  Well appearing, well developed, well nourished, NAD HEENT: Atraumatic, Normocephalic, oropharynx clear. NECK: Supple without lymphadenopathy or thyromegaly. LUNGS: Clear to auscultation bilaterally. HEART: Regular Rate and Rhythm without murmurs, gallops, or rubs. ABDOMEN: Soft, non-tender, No Masses. EXTREMITIES: Moves all extremities well.  Without clubbing, cyanosis, or edema. NEUROLOGIC:  Alert and oriented x 3, normal gait, CN II-XII grossly intact.  MENTAL STATUS:  Appropriate. BACK:  Non-tender to palpation.  No CVAT SKIN:  Warm, dry and intact.     Results: U/A: trace protein  Bladder scan: 160 ml (patient unable to void prior to test) PVR:
